# Patient Record
Sex: Male | Born: 1975 | Race: Black or African American | Hispanic: No | Marital: Single | Smoking: Current every day smoker
Health system: Southern US, Community
[De-identification: ages and names within clinical notes are randomized; demographics above are authoritative.]

---

## 1998-06-13 ENCOUNTER — Emergency Department (HOSPITAL_COMMUNITY): Admission: EM | Admit: 1998-06-13 | Discharge: 1998-06-13 | Payer: Self-pay | Admitting: Emergency Medicine

## 1998-06-13 ENCOUNTER — Encounter: Payer: Self-pay | Admitting: Emergency Medicine

## 2000-12-05 ENCOUNTER — Emergency Department (HOSPITAL_COMMUNITY): Admission: EM | Admit: 2000-12-05 | Discharge: 2000-12-05 | Payer: Self-pay

## 2001-02-08 ENCOUNTER — Emergency Department (HOSPITAL_COMMUNITY): Admission: EM | Admit: 2001-02-08 | Discharge: 2001-02-08 | Payer: Self-pay

## 2001-03-24 ENCOUNTER — Emergency Department (HOSPITAL_COMMUNITY): Admission: EM | Admit: 2001-03-24 | Discharge: 2001-03-24 | Payer: Self-pay | Admitting: Emergency Medicine

## 2001-05-15 ENCOUNTER — Emergency Department (HOSPITAL_COMMUNITY): Admission: EM | Admit: 2001-05-15 | Discharge: 2001-05-15 | Payer: Self-pay

## 2005-06-13 ENCOUNTER — Emergency Department (HOSPITAL_COMMUNITY): Admission: EM | Admit: 2005-06-13 | Discharge: 2005-06-13 | Payer: Self-pay | Admitting: Emergency Medicine

## 2006-09-16 ENCOUNTER — Emergency Department (HOSPITAL_COMMUNITY): Admission: EM | Admit: 2006-09-16 | Discharge: 2006-09-16 | Payer: Self-pay | Admitting: Emergency Medicine

## 2006-12-25 ENCOUNTER — Ambulatory Visit: Payer: Self-pay | Admitting: Internal Medicine

## 2009-10-10 ENCOUNTER — Emergency Department (HOSPITAL_COMMUNITY): Admission: EM | Admit: 2009-10-10 | Discharge: 2009-10-10 | Payer: Self-pay | Admitting: Family Medicine

## 2010-01-09 ENCOUNTER — Emergency Department (HOSPITAL_COMMUNITY): Admission: EM | Admit: 2010-01-09 | Discharge: 2010-01-09 | Payer: Self-pay | Admitting: Emergency Medicine

## 2016-03-18 ENCOUNTER — Encounter (HOSPITAL_COMMUNITY): Payer: Self-pay | Admitting: *Deleted

## 2016-03-18 ENCOUNTER — Emergency Department (HOSPITAL_COMMUNITY)
Admission: EM | Admit: 2016-03-18 | Discharge: 2016-03-18 | Disposition: A | Discharge status: Home / Self Care | Payer: No Typology Code available for payment source | Attending: Emergency Medicine | Admitting: Emergency Medicine

## 2016-03-18 ENCOUNTER — Emergency Department (HOSPITAL_COMMUNITY): Payer: No Typology Code available for payment source

## 2016-03-18 DIAGNOSIS — F17210 Nicotine dependence, cigarettes, uncomplicated: Secondary | ICD-10-CM | POA: Insufficient documentation

## 2016-03-18 DIAGNOSIS — S3992XA Unspecified injury of lower back, initial encounter: Secondary | ICD-10-CM | POA: Diagnosis present

## 2016-03-18 DIAGNOSIS — Y92410 Unspecified street and highway as the place of occurrence of the external cause: Secondary | ICD-10-CM | POA: Diagnosis not present

## 2016-03-18 DIAGNOSIS — Y939 Activity, unspecified: Secondary | ICD-10-CM | POA: Insufficient documentation

## 2016-03-18 DIAGNOSIS — Y999 Unspecified external cause status: Secondary | ICD-10-CM | POA: Insufficient documentation

## 2016-03-18 DIAGNOSIS — S39012A Strain of muscle, fascia and tendon of lower back, initial encounter: Secondary | ICD-10-CM | POA: Insufficient documentation

## 2016-03-18 MED ORDER — METHOCARBAMOL 500 MG PO TABS: 500.0000 mg | ORAL_TABLET | Freq: Three times a day (TID) | ORAL | 0 refills | Status: DC | PRN

## 2016-03-18 MED ORDER — METHOCARBAMOL 500 MG PO TABS
1000.0000 mg | ORAL_TABLET | Freq: Once | ORAL | Status: AC
  Administered 2016-03-18: 1000 mg via ORAL
  Filled 2016-03-18: qty 2

## 2016-03-18 MED ORDER — IBUPROFEN 800 MG PO TABS
800.0000 mg | ORAL_TABLET | Freq: Once | ORAL | Status: AC
  Administered 2016-03-18: 800 mg via ORAL
  Filled 2016-03-18: qty 1

## 2016-03-18 MED ORDER — HYDROCODONE-ACETAMINOPHEN 5-325 MG PO TABS
1.0000 | ORAL_TABLET | Freq: Once | ORAL | Status: AC
Start: 2016-03-18 — End: 2016-03-18
  Administered 2016-03-18: 1 via ORAL
  Filled 2016-03-18: qty 1

## 2016-03-18 MED ORDER — NAPROXEN 500 MG PO TABS: 500.0000 mg | ORAL_TABLET | Freq: Two times a day (BID) | ORAL | 0 refills | Status: DC
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
## 2016-03-18 NOTE — ED Notes (Signed)
MD at bedside. 

## 2018-06-24 ENCOUNTER — Encounter (HOSPITAL_COMMUNITY): Payer: Self-pay | Admitting: Emergency Medicine

## 2018-06-24 ENCOUNTER — Emergency Department (HOSPITAL_COMMUNITY)
Admission: EM | Admit: 2018-06-24 | Discharge: 2018-06-24 | Disposition: A | Discharge status: Home / Self Care | Payer: Self-pay | Attending: Emergency Medicine | Admitting: Emergency Medicine

## 2018-06-24 ENCOUNTER — Emergency Department (HOSPITAL_COMMUNITY): Payer: Self-pay

## 2018-06-24 DIAGNOSIS — F17210 Nicotine dependence, cigarettes, uncomplicated: Secondary | ICD-10-CM | POA: Insufficient documentation

## 2018-06-24 DIAGNOSIS — L02512 Cutaneous abscess of left hand: Secondary | ICD-10-CM | POA: Insufficient documentation

## 2018-06-24 DIAGNOSIS — Z23 Encounter for immunization: Secondary | ICD-10-CM | POA: Insufficient documentation

## 2018-06-24 DIAGNOSIS — L02519 Cutaneous abscess of unspecified hand: Secondary | ICD-10-CM

## 2018-06-24 MED ORDER — LIDOCAINE HCL 2 % IJ SOLN
10.0000 mL | Freq: Once | INTRAMUSCULAR | Status: AC
  Administered 2018-06-24: 200 mg
  Filled 2018-06-24: qty 20

## 2018-06-24 MED ORDER — SULFAMETHOXAZOLE-TRIMETHOPRIM 800-160 MG PO TABS
1.0000 | ORAL_TABLET | Freq: Once | ORAL | Status: AC
  Administered 2018-06-24: 1 via ORAL
  Filled 2018-06-24: qty 1

## 2018-06-24 MED ORDER — CEPHALEXIN 500 MG PO CAPS
500.0000 mg | ORAL_CAPSULE | Freq: Once | ORAL | Status: AC
  Administered 2018-06-24: 500 mg via ORAL
  Filled 2018-06-24: qty 1

## 2018-06-24 MED ORDER — TETANUS-DIPHTH-ACELL PERTUSSIS 5-2.5-18.5 LF-MCG/0.5 IM SUSP
0.5000 mL | Freq: Once | INTRAMUSCULAR | Status: AC
  Administered 2018-06-24: 0.5 mL via INTRAMUSCULAR
  Filled 2018-06-24: qty 0.5

## 2018-06-24 MED ORDER — SULFAMETHOXAZOLE-TRIMETHOPRIM 800-160 MG PO TABS: 1.0000 | ORAL_TABLET | Freq: Two times a day (BID) | ORAL | 0 refills | Status: AC

## 2018-06-24 MED ORDER — LIDOCAINE HCL 1 % IJ SOLN
INTRAMUSCULAR | Status: AC
  Filled 2018-06-24: qty 20

## 2018-06-24 MED ORDER — CEPHALEXIN 500 MG PO CAPS: 500.0000 mg | ORAL_CAPSULE | Freq: Four times a day (QID) | ORAL | 0 refills | Status: AC

## 2018-06-24 NOTE — ED Provider Notes (Signed)
[facility]-EMERGENCY DEPT Provider Note   CSN: [id number] Arrival date & time: 06/24/18  1244     History   Chief Complaint Chief Complaint  Patient presents with  . Hand Pain    HPI Caleb Torres is a 42 y.o. male.  HPI  Patient is a 42 year old male with no significant past medical history presenting for left hand pain.  Patient reports that his symptoms began initially 2 weeks ago.  He reports he had a splinter from a piece of wood while working on a project.  He reports that his mother assisted in trying to dislodge it from his hand, however he reports that there may still be some residual splinter.  He reports increasing pain and swelling in couple days ago noted some erythema surrounding the site.  No drainage.  Tetanus shot status unknown.  History reviewed. No pertinent past medical history.  There are no active problems to display for this patient.   History reviewed. No pertinent surgical history.      Home Medications    Prior to Admission medications   Medication Sig Start Date End Date Taking? Authorizing Provider  cephALEXin (KEFLEX) 500 MG capsule Take 1 capsule (500 mg total) by mouth 4 (four) times daily for 7 days. 06/24/18 07/01/18  Aviva KluverMurray, Atticus Wedin B, PA-C  sulfamethoxazole-trimethoprim (BACTRIM DS,SEPTRA DS) 800-160 MG tablet Take 1 tablet by mouth 2 (two) times daily for 7 days. 06/24/18 07/01/18  Elisha PonderMurray, Decari Duggar B, PA-C    Family History No family history on file.  Social History Social History   Tobacco Use  . Smoking status: Current Every Day Smoker    Packs/day: 0.50    Types: Cigarettes  . Smokeless tobacco: Never Used  Substance Use Topics  . Alcohol use: No  . Drug use: No     Allergies   Patient has no known allergies.   Review of Systems Review of Systems  Constitutional: Negative for chills and fever.  Skin: Positive for color change and wound.  Neurological: Negative for weakness and numbness.      Physical Exam Updated Vital Signs BP (!) 125/98 (BP Location: Left Arm)   Pulse 63   Temp 98.1 F (36.7 C) (Oral)   Resp 18   SpO2 100%   Physical Exam  Constitutional: He appears well-developed and well-nourished. No distress.  Sitting comfortably in bed.  HENT:  Head: Normocephalic and atraumatic.  Eyes: Conjunctivae are normal. Right eye exhibits no discharge. Left eye exhibits no discharge.  EOMs normal to gross examination.  Neck: Normal range of motion.  Cardiovascular: Normal rate and regular rhythm.  Intact, 2+ radial pulse of RUE.   Pulmonary/Chest:  Normal respiratory effort. Patient converses comfortably. No audible wheeze or stridor.  Abdominal: He exhibits no distension.  Musculoskeletal: Normal range of motion.  See clinical photo for details.  Patient has a callused area overlying the left hyperthenar region.  No active drainage. Mild amount of surrounding erythema.   Neurological: He is alert.  Cranial nerves intact to gross observation. Patient moves extremities without difficulty.  Skin: Skin is warm and dry. He is not diaphoretic.  Psychiatric: He has a normal mood and affect. His behavior is normal. Judgment and thought content normal.  Nursing note and vitals reviewed.      ED Treatments / Results  Labs (all labs ordered are listed, but only abnormal results are displayed) Labs Reviewed - No data to display  EKG None  Radiology Dg Hand Complete Left  Result Date: 06/24/2018 CLINICAL DATA:  Left hand pain EXAM: LEFT HAND - COMPLETE 3+ VIEW COMPARISON:  None. FINDINGS: There is no evidence of fracture or dislocation. There is no evidence of arthropathy or other focal bone abnormality. Soft tissues are unremarkable. IMPRESSION: Negative. Electronically Signed   By: Elige Ko   On: 06/24/2018 14:52    Procedures .Marland KitchenIncision and Drainage Date/Time: 06/24/2018 11:41 PM Performed by: Elisha Ponder, PA-C Authorized by: Elisha Ponder,  PA-C   Consent:    Consent obtained:  Verbal   Consent given by:  Patient   Risks discussed:  Bleeding, incomplete drainage and pain Location:    Type:  Abscess   Location:  Upper extremity   Upper extremity location:  Hand   Hand location:  L hand Pre-procedure details:    Skin preparation:  Betadine Anesthesia (see MAR for exact dosages):    Anesthesia method:  Local infiltration   Local anesthetic:  Lidocaine 2% w/o epi Procedure type:    Complexity:  Simple Procedure details:    Needle aspiration: no     Incision types:  Stab incision   Incision depth:  Dermal   Scalpel blade:  11   Wound management:  Probed and deloculated   Drainage:  Purulent   Drainage amount:  Scant   Wound treatment:  Wound left open Post-procedure details:    Patient tolerance of procedure:  Procedure terminated at patient's request   (including critical care time)  EMERGENCY DEPARTMENT US SOFT TISSUE INTERPRETATION "Study: Limited Soft Tissue Ultrasound"  INDICATIONS: Soft tissue infection Multiple views of the body part were obtained in real-time with a multi-frequency linear probe PERFORMED BY:  Myself IMAGES ARCHIVED?: Yes SIDE:Left BODY PART:hand FINDINGS: Other Fluid collection; possible foreign body within fluid collection. INTERPRETATION:  Fluid collection present.    CPT: Neck [id number]  Upper extremity [id number]  Axilla [id number]  Chest wall 19147-82  Beast 95621-30  Upper back 86578-46  Lower back 96295-28  Abdominal wall 41324-40  Pelvic wall 10272-53  Lower extremity 66440-34  Other soft tissue 74259-56   Medications Ordered in ED Medications  Tdap (BOOSTRIX) injection 0.5 mL (0.5 mLs Intramuscular Given 06/24/18 1540)  lidocaine (XYLOCAINE) 2 % (with pres) injection 200 mg (200 mg Infiltration Given 06/24/18 1546)  sulfamethoxazole-trimethoprim (BACTRIM DS,SEPTRA DS) 800-160 MG per tablet 1 tablet (1 tablet Oral Given 06/24/18 1731)  cephALEXin (KEFLEX)  capsule 500 mg (500 mg Oral Given 06/24/18 1730)     Initial Impression / Assessment and Plan / ED Course  I have reviewed the triage vital signs and the nursing notes.  Pertinent labs & imaging results that were available during my care of the patient were reviewed by me and considered in my medical decision making (see chart for details).  Clinical Course as of Jun 24 2314  Wynelle Link Jun 24, 2018  1756 Spoke with Dr. Dion Saucier who states pt can come see him in the office on Wednesday.   [AM]    Clinical Course User Index [AM] Elisha Ponder, PA-C    Patient nontoxic-appearing, afebrile, in no acute distress.  Neurovascularly intact in the left upper extremity.  Patient with possible retained foreign body from his wooden splinter.  I&D performed successfully.  Patient would not recovered, and patient terminated procedure due to tolerance.  I discussed with the patient that we did not confirm that sliver was removed, but will be given follow-up to hand surgery for recheck later this week. Tdap updated.   Discussed case  with Dr. Dion Saucier of orthopedics, states patient can follow-up on Wednesday, 06-27-2018 for recheck.  Patient instructed in the interim perform warm compresses and how to perform dressing changes.  Patient given antibiotic coverage given surrounding erythema.  Return precautions given for any increasing erythema, drainage, or swelling.  Patient is in understanding and agrees with the plan of care.  Final Clinical Impressions(s) / ED Diagnoses   Final diagnoses:  Abscess of hand    ED Discharge Orders         Ordered    sulfamethoxazole-trimethoprim (BACTRIM DS,SEPTRA DS) 800-160 MG tablet  2 times daily     06/24/18 1740    cephALEXin (KEFLEX) 500 MG capsule  4 times daily     06/24/18 1740           Delia Chimes 06/24/18 2341    Arby Barrette, MD 06/25/18 0017

## 2018-06-24 NOTE — Discharge Instructions (Signed)
Please see the information and instructions below regarding your visit.  Your diagnoses today include:  1. Abscess of hand     Abscesses form when an infection in your skin starts to collect bacteria and white blood cells, walling it off from the rest of your body to protect you from a bigger infection. Risk factors for this type of infection include:  ?Break in the skin ?Diabetes ?Swollen areas  Sometimes the infection starts to spread to surrounding tissue, causing redness and swelling. We call this cellulitis.   Tests performed today include: See side panel of your discharge paperwork for testing performed today. Vital signs are listed at the bottom of these instructions.   Medications prescribed:    Take any prescribed medications only as prescribed, and any over the counter medications only as directed on the packaging.   Please take all of your antibiotics until finished.   You may develop abdominal discomfort or nausea from the antibiotic. If this occurs, you may take it with food. Some patients also get diarrhea with antibiotics. You may help offset this with probiotics which you can buy or get in yogurt. Do not eat or take the probiotics until 2 hours after your antibiotic. Some women develop vaginal yeast infections after antibiotics. If you develop unusual vaginal discharge after being on this medication, please see your primary care provider.   Some people develop allergies to antibiotics. Symptoms of antibiotic allergy can be mild and include a flat rash and itching. They can also be more serious and include:  ?Hives - Hives are raised, red patches of skin that are usually very itchy.  ?Lip or tongue swelling  ?Trouble swallowing or breathing  ?Blistering of the skin or mouth.  If you have any of these serious symptoms, please seek emergency medical care immediately.  Please alternate ibuprofen and Tylenol for the pain.    Home care instructions:  Please follow  any educational materials contained in this packet.   Some things that may promote healing of your wound and infection include:  Raise your arm or leg to reduce swelling - Raise the arm or leg up above the level of your heart 3 or 4 times a day, for 30 minutes each time. Keep the infected area clean and dry. You can take a shower or bath, but be sure to pat the area dry with a towel afterward. Do not put any antibiotic ointments or creams on the area. Reapply a dry gauze dressing any time the bandage has become soaked with drainage, or after cleansing the wound.  Apply warm compresses to the wound 3-4 times daily to encourage drainage.   Return instructions:  Please return to the Emergency Department if you experience worsening symptoms. You should return for reevaluation of your infection if you notice spreading redness, increased swelling, an abscess develops, or you develop signs and symptoms of a systemic illness such as fever and chills.  Please return if you have any other emergent concerns.  Additional Information:   Your vital signs today were: BP (!) 125/98 (BP Location: Left Arm)    Pulse 96    Temp 98.1 F (36.7 C) (Oral)    Resp 18    SpO2 99%  If your blood pressure (BP) was elevated on multiple readings during this visit above 130 for the top number or above 80 for the bottom number, please have this repeated by your primary care provider within one month. --------------  Thank you for allowing us to  participate in your care today.

## 2018-06-24 NOTE — ED Triage Notes (Signed)
Per pt, states he removed a splinter from left palm 2 weeks ago-states he is not sure if he got all of it out-states pain and swelling

## 2018-12-29 ENCOUNTER — Emergency Department (HOSPITAL_COMMUNITY)
Admission: EM | Admit: 2018-12-29 | Discharge: 2018-12-29 | Disposition: A | Discharge status: Home / Self Care | Payer: Self-pay | Attending: Emergency Medicine | Admitting: Emergency Medicine

## 2018-12-29 ENCOUNTER — Encounter (HOSPITAL_COMMUNITY): Payer: Self-pay | Admitting: Emergency Medicine

## 2018-12-29 ENCOUNTER — Other Ambulatory Visit: Payer: Self-pay

## 2018-12-29 DIAGNOSIS — F17210 Nicotine dependence, cigarettes, uncomplicated: Secondary | ICD-10-CM | POA: Insufficient documentation

## 2018-12-29 DIAGNOSIS — M25561 Pain in right knee: Secondary | ICD-10-CM | POA: Insufficient documentation

## 2018-12-29 MED ORDER — NAPROXEN 375 MG PO TABS: 375.0000 mg | ORAL_TABLET | Freq: Two times a day (BID) | ORAL | 0 refills | Status: AC
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
## 2018-12-29 NOTE — ED Notes (Signed)
Patient ambulated from triage to room 9.
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
## 2018-12-29 NOTE — Discharge Instructions (Addendum)
You may alternate taking Tylenol and Naproxen as needed for pain control. You may take Naproxen twice daily as directed on your discharge paperwork and you may take  [phone] mg of Tylenol every 6 hours. Do not exceed 4000 mg of Tylenol daily as this can lead to liver damage. Also, make sure to take Naproxen with meals as it can cause an upset stomach. Do not take other NSAIDs while taking Naproxen such as (Aleve, Ibuprofen, Aspirin, Celebrex, etc) and do not take more than the prescribed dose as this can lead to ulcers and bleeding in your GI tract. You may use warm and cold compresses to help with your symptoms.   Please follow up with your primary doctor or the orthopedic doctor within the next 7-10 days for re-evaluation and further treatment of your symptoms.   Please return to the ER sooner if you have any new or worsening symptoms.

## 2018-12-29 NOTE — ED Notes (Signed)
Patient given discharge teaching and verbalized understanding. Patient ambulated out of ED with a steady gait. 

## 2019-01-19 ENCOUNTER — Emergency Department (HOSPITAL_COMMUNITY): Payer: Self-pay

## 2019-01-19 ENCOUNTER — Emergency Department (HOSPITAL_COMMUNITY)
Admission: EM | Admit: 2019-01-19 | Discharge: 2019-01-19 | Disposition: A | Discharge status: Home / Self Care | Payer: Self-pay | Attending: Emergency Medicine | Admitting: Emergency Medicine

## 2019-01-19 ENCOUNTER — Encounter (HOSPITAL_COMMUNITY): Payer: Self-pay

## 2019-01-19 ENCOUNTER — Other Ambulatory Visit: Payer: Self-pay

## 2019-01-19 DIAGNOSIS — L03211 Cellulitis of face: Secondary | ICD-10-CM | POA: Insufficient documentation

## 2019-01-19 DIAGNOSIS — F17210 Nicotine dependence, cigarettes, uncomplicated: Secondary | ICD-10-CM | POA: Insufficient documentation

## 2019-01-19 LAB — BASIC METABOLIC PANEL
Anion gap: 11 (ref 5–15)
BUN: 10 mg/dL (ref 6–20)
CO2: 23 mmol/L (ref 22–32)
Calcium: 9.2 mg/dL (ref 8.9–10.3)
Chloride: 104 mmol/L (ref 98–111)
Creatinine, Ser: 1.06 mg/dL (ref 0.61–1.24)
GFR calc Af Amer: >60 mL/min (ref >60)
GFR calc non Af Amer: >60 mL/min (ref >60)
Glucose, Bld: 93 mg/dL (ref 70–99)
Potassium: 3.9 mmol/L (ref 3.5–5.1)
Sodium: 138 mmol/L (ref 135–145)

## 2019-01-19 LAB — CBC
HCT: 42.5 % (ref 39.0–52.0)
Hemoglobin: 14 g/dL (ref 13.0–17.0)
MCH: 32.5 pg (ref 26.0–34.0)
MCHC: 32.9 g/dL (ref 30.0–36.0)
MCV: 98.6 fL (ref 80.0–100.0)
Platelets: 240 10*3/uL (ref 150–400)
RBC: 4.31 MIL/uL (ref 4.22–5.81)
RDW: 13.1 % (ref 11.5–15.5)
WBC: 10.7 10*3/uL — ABNORMAL HIGH (ref 4.0–10.5)
nRBC: 0 % (ref 0.0–0.2)

## 2019-01-19 MED ORDER — IOHEXOL 300 MG/ML  SOLN
75.0000 mL | Freq: Once | INTRAMUSCULAR | Status: AC | PRN
  Administered 2019-01-19: 75 mL via INTRAVENOUS

## 2019-01-19 MED ORDER — SULFAMETHOXAZOLE-TRIMETHOPRIM 800-160 MG PO TABS: 1.0000 | ORAL_TABLET | Freq: Two times a day (BID) | ORAL | 0 refills | Status: AC

## 2019-01-19 MED ORDER — AMOXICILLIN-POT CLAVULANATE 875-125 MG PO TABS: 1.0000 | ORAL_TABLET | Freq: Two times a day (BID) | ORAL | 0 refills | Status: AC

## 2019-01-19 MED ORDER — SODIUM CHLORIDE (PF) 0.9 % IJ SOLN
INTRAMUSCULAR | Status: AC
  Filled 2019-01-19: qty 50

## 2019-01-19 MED ORDER — FLUORESCEIN SODIUM 1 MG OP STRP
1.0000 | ORAL_STRIP | Freq: Once | OPHTHALMIC | Status: AC
  Administered 2019-01-19: 1 via OPHTHALMIC
  Filled 2019-01-19: qty 1

## 2019-01-19 MED ORDER — TETRACAINE HCL 0.5 % OP SOLN
1.0000 [drp] | Freq: Once | OPHTHALMIC | Status: AC
  Administered 2019-01-19: 12:00:00 1 [drp] via OPHTHALMIC
  Filled 2019-01-19: qty 4

## 2019-01-19 NOTE — ED Triage Notes (Addendum)
Patient dropped off by friend.    Patient c/o nasal infection.  Patient states his right side of face is swollen and hurts. 8/10 sore   Denies sore throat or lost of taste.  Denies being in contact with anyone who has been sick or tested positive for covid.  Denies difficulty breathing  Denies N/V Denies Diarrhea    A/ox4 Ambulatory in triage.

## 2019-01-19 NOTE — ED Provider Notes (Signed)
[facility]-EMERGENCY DEPT Provider Note   CSN: [id number] Arrival date & time: 01/19/19  0935    History   Chief Complaint Chief Complaint  Patient presents with   Nasal Congestion    HPI Caleb Torres is a 43 y.o. male with no PMHx who presents to the ED with c/o right-sided sinus congestion.   Patient states his symptoms began 3 days ago and have progressively worsened. He describes a feeling of swelling and fullness in his right-sided nasal area that has also been causing right sided eye swelling and drainage. Drainage has started to "smell bad." He endorses throbbing pain in his right eye and surrounding orbital region that also occurs with lateral eye movement. Pain in the right sided sinus area is exacerbated by movement and partially alleviated with Vicks and hot towel soaks. Endorses headache, mainly on the right temple, and minimal rhinorrhea but denies fever, chills, sore throat, ear pain, ear fullness cough, SOB. Denies sexual activity involving the affected areas. Denies drug use.    The history is provided by the patient.    History reviewed. No pertinent past medical history.  There are no active problems to display for this patient.   History reviewed. No pertinent surgical history.      Home Medications    Prior to Admission medications   Medication Sig Start Date End Date Taking? Authorizing Provider  amoxicillin-clavulanate (AUGMENTIN) 875-125 MG tablet Take 1 tablet by mouth 2 (two) times daily for 10 days. 01/19/19 01/29/19  Verdene LennertBasaraba, Lore Polka, MD  sulfamethoxazole-trimethoprim (BACTRIM DS) 800-160 MG tablet Take 1 tablet by mouth 2 (two) times daily. 01/19/19   Verdene LennertBasaraba, Schneur Crowson, MD    Family History History reviewed. No pertinent family history.  Social History Social History   Tobacco Use   Smoking status: Current Every Day Smoker    Packs/day: 0.50    Types: Cigarettes   Smokeless tobacco: Never Used  Substance Use Topics    Alcohol use: No   Drug use: No     Allergies   Patient has no known allergies.   Review of Systems Review of Systems  Constitutional: Negative for chills and fever.  HENT: Positive for congestion, facial swelling, rhinorrhea (minimal), sinus pressure and sinus pain. Negative for dental problem, ear discharge, ear pain, hearing loss, sore throat and tinnitus.   Eyes: Positive for pain and discharge. Negative for photophobia and visual disturbance.       Right eyelid swelling  Respiratory: Negative for cough and shortness of breath.   All other systems reviewed and are negative.    Physical Exam Updated Vital Signs BP 118/81 (BP Location: Left Arm)    Pulse 97    Temp 99.1 F (37.3 C) (Oral)    Resp 17    SpO2 97%   Physical Exam Vitals signs reviewed.  Constitutional:      General: He is not in acute distress.    Appearance: Normal appearance.  HENT:     Head: Normocephalic and atraumatic. Right periorbital erythema present. No abrasion, masses or laceration.     Nose:     Right Sinus: Maxillary sinus tenderness (minimal) present.     Mouth/Throat:     Mouth: Mucous membranes are moist.     Dentition: Dental caries present.     Pharynx: Oropharynx is clear.  Eyes:     General:        Right eye: Discharge present. No foreign body or hordeolum.     Extraocular Movements: Extraocular  movements intact.     Conjunctiva/sclera: Conjunctivae normal.     Pupils: Pupils are equal, round, and reactive to light.     Comments: Some purulent drainage seen at inner corner of right eye. Wood lamp examination did not show any abnormalities.   Cardiovascular:     Rate and Rhythm: Normal rate and regular rhythm.  Pulmonary:     Effort: Pulmonary effort is normal.     Breath sounds: Normal breath sounds.  Skin:    General: Skin is warm and dry.  Neurological:     General: No focal deficit present.     Mental Status: He is alert and oriented to person, place, and time.   Psychiatric:        Mood and Affect: Mood normal.        Behavior: Behavior normal.      ED Treatments / Results  Labs (all labs ordered are listed, but only abnormal results are displayed) Labs Reviewed  CBC - Abnormal; Notable for the following components:      Result Value   WBC 10.7 (*)    All other components within normal limits  BASIC METABOLIC PANEL    EKG None  Radiology Ct Maxillofacial W Contrast  Result Date: 01/19/2019 CLINICAL DATA:  Nasal infection with right-sided facial swelling. EXAM: CT MAXILLOFACIAL WITH CONTRAST TECHNIQUE: Multidetector CT imaging of the maxillofacial structures was performed with intravenous contrast. Multiplanar CT image reconstructions were also generated. CONTRAST:  6mL OMNIPAQUE IOHEXOL 300 MG/ML  SOLN COMPARISON:  None. 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 This includes lateral cortical erosion. This is probably the origin of the right facial cellulitis that is present. No evidence of drainable soft tissue abscess. There is probably also communication with the right maxillary sinus which shows subtotal opacification. No other acutely significant bone finding. 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 Orbits: Normal.  No orbital inflammatory disease. Sinuses: Paranasal sinuses on the left are clear. On the right, there is subtotal opacification of the right maxillary sinus, probably relating to the maxillary dental pathology. There is also subtotal opacification of the right ethmoid sinuses and mild mucosal thickening at the right frontal ethmoid junction. Soft tissues: Nonspecific inflammatory changes of the right face consistent with cellulitis as noted above. Limited intracranial: Negative IMPRESSION: Right facial swelling consistent with facial cellulitis. No evidence of drainable abscess. The etiology of this probably  relates to dental and periodontal disease at right maxillary teeth 4 and 5. There is lateral cortical breakthrough. There is probably also breakthrough into the maxillary sinus, responsible for inflammatory changes of the right maxillary sinus. Patient additionally has right ethmoid and frontal sinus inflammation as well. Electronically Signed   By: Nelson Chimes M.D.   On: 01/19/2019 13:16    Procedures Procedures (including critical care time)  Medications Ordered in ED Medications  sodium chloride (PF) 0.9 % injection (has no administration in time range)  tetracaine (PONTOCAINE) 0.5 % ophthalmic solution 1 drop (1 drop Right Eye Given by Other 01/19/19 1134)  fluorescein ophthalmic strip 1 strip (1 strip Right Eye Given by Other 01/19/19 1134)  iohexol (OMNIPAQUE) 300 MG/ML solution 75 mL (75 mLs Intravenous Contrast Given 01/19/19 1306)     Initial Impression / Assessment and Plan / ED Course  I have reviewed the triage vital signs and the nursing notes.  Pertinent labs & imaging results that were available  during my care of the patient were reviewed by me and considered in my medical decision making (see chart for details).  Pre-Septal Cellulitis:  Caleb Torres presented to the ED with c/o sinus fullness with right eye and facial pain. His right eye had pain associated with movement of his head and with lateral movement of EOM. On examination, he had minimal right maxillary tenderness to palpation, however he did have significant pre-orbital swelling and tenderness. In order to rule out orbital cellulitis, a CT maxillofacial w/wo contrast was ordered. CBC was WNL except for a slightly elevated WBC consistent with a cellulitis vs sinusitis. Wood slit lamp did not show any lesions. CT results came back consistent with a facial cellulitis with sinus inflammation likely secondary to dental and periodental disease. There was no involvement of the orbital area. Due to the extent of patient's inflammation  and closeness to his orbital region, we decided to treat as pre-septal cellulitis. Bactrim 160-800mg  BID and Augmentin 875mg  BID for 10 days each was prescribed.   Patient verbalized understanding regarding his examinations and the treatment plan. He is in agreement and plans to complete antibiotic treatment. He will follow up with his regular dentist after he finishes his antibiotics for treatment of his dental disease to avoid future complications. Caleb Torres was found to be safe for discharge at that time.   Final Clinical Impressions(s) / ED Diagnoses   Final diagnoses:  Facial cellulitis    ED Discharge Orders         Ordered    sulfamethoxazole-trimethoprim (BACTRIM DS) 800-160 MG tablet  2 times daily     01/19/19 1345    amoxicillin-clavulanate (AUGMENTIN) 875-125 MG tablet  2 times daily     01/19/19 1345           Verdene LennertBasaraba, Josealfredo Adkins, MD 01/19/19 1432    Sabas SousBero, Michael M, MD 01/23/19 1030

## 2019-01-19 NOTE — Discharge Instructions (Addendum)
Please return to the ED if your symptoms worsen.

## 2019-07-02 ENCOUNTER — Emergency Department (HOSPITAL_COMMUNITY)
Admission: EM | Admit: 2019-07-02 | Discharge: 2019-07-02 | Disposition: A | Discharge status: Home / Self Care | Payer: Self-pay | Attending: Emergency Medicine | Admitting: Emergency Medicine

## 2019-07-02 ENCOUNTER — Other Ambulatory Visit: Payer: Self-pay

## 2019-07-02 ENCOUNTER — Emergency Department (HOSPITAL_COMMUNITY): Payer: Self-pay

## 2019-07-02 ENCOUNTER — Encounter (HOSPITAL_COMMUNITY): Payer: Self-pay

## 2019-07-02 DIAGNOSIS — M25462 Effusion, left knee: Secondary | ICD-10-CM | POA: Insufficient documentation

## 2019-07-02 DIAGNOSIS — F17210 Nicotine dependence, cigarettes, uncomplicated: Secondary | ICD-10-CM | POA: Insufficient documentation

## 2019-07-02 DIAGNOSIS — M25562 Pain in left knee: Secondary | ICD-10-CM | POA: Insufficient documentation

## 2019-07-02 MED ORDER — NAPROXEN 500 MG PO TABS: 500.0000 mg | ORAL_TABLET | Freq: Two times a day (BID) | ORAL | 0 refills | Status: DC

## 2019-07-02 NOTE — Discharge Instructions (Signed)
Try to wear your knee sleeve at work and take the anti-inflammatory.  Over the next few days rest to try to electrically get better.
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
## 2020-02-24 ENCOUNTER — Encounter (HOSPITAL_COMMUNITY): Payer: Self-pay

## 2020-02-24 ENCOUNTER — Other Ambulatory Visit: Payer: Self-pay

## 2020-02-24 ENCOUNTER — Emergency Department (HOSPITAL_COMMUNITY)
Admission: EM | Admit: 2020-02-24 | Discharge: 2020-02-24 | Disposition: A | Discharge status: Home / Self Care | Payer: Self-pay | Attending: Emergency Medicine | Admitting: Emergency Medicine

## 2020-02-24 ENCOUNTER — Emergency Department (HOSPITAL_COMMUNITY): Payer: Self-pay

## 2020-02-24 DIAGNOSIS — M25561 Pain in right knee: Secondary | ICD-10-CM | POA: Insufficient documentation

## 2020-02-24 DIAGNOSIS — F17210 Nicotine dependence, cigarettes, uncomplicated: Secondary | ICD-10-CM | POA: Insufficient documentation

## 2020-02-24 DIAGNOSIS — M26629 Arthralgia of temporomandibular joint, unspecified side: Secondary | ICD-10-CM | POA: Insufficient documentation

## 2020-02-24 DIAGNOSIS — M25562 Pain in left knee: Secondary | ICD-10-CM | POA: Insufficient documentation
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
## 2020-02-24 NOTE — Discharge Instructions (Signed)
Please follow up with [facility] for further evaluation of your knee pain. I would recommend 600 mg Ibuprofen every 6-8 hours as needed for pain in the meantime.   Return to the ED for any worsening symptoms

## 2020-02-24 NOTE — ED Triage Notes (Signed)
Patient c/o bilateral knee pain and stiffness.

## 2021-01-08 ENCOUNTER — Encounter (HOSPITAL_COMMUNITY): Payer: Self-pay

## 2021-01-08 ENCOUNTER — Other Ambulatory Visit: Payer: Self-pay

## 2021-01-08 ENCOUNTER — Emergency Department (HOSPITAL_COMMUNITY)
Admission: EM | Admit: 2021-01-08 | Discharge: 2021-01-08 | Disposition: A | Discharge status: Home / Self Care | Payer: BC Managed Care – PPO | Attending: Emergency Medicine | Admitting: Emergency Medicine

## 2021-01-08 ENCOUNTER — Emergency Department (HOSPITAL_COMMUNITY)
Admission: EM | Admit: 2021-01-08 | Discharge: 2021-01-08 | Disposition: A | Discharge status: Home / Self Care | Payer: BC Managed Care – PPO | Source: Home / Self Care | Attending: Emergency Medicine | Admitting: Emergency Medicine

## 2021-01-08 DIAGNOSIS — H6123 Impacted cerumen, bilateral: Secondary | ICD-10-CM | POA: Insufficient documentation

## 2021-01-08 DIAGNOSIS — Z20822 Contact with and (suspected) exposure to covid-19: Secondary | ICD-10-CM | POA: Diagnosis not present

## 2021-01-08 DIAGNOSIS — R059 Cough, unspecified: Secondary | ICD-10-CM | POA: Diagnosis present

## 2021-01-08 DIAGNOSIS — Z5321 Procedure and treatment not carried out due to patient leaving prior to being seen by health care provider: Secondary | ICD-10-CM

## 2021-01-08 DIAGNOSIS — J029 Acute pharyngitis, unspecified: Secondary | ICD-10-CM | POA: Insufficient documentation

## 2021-01-08 DIAGNOSIS — J069 Acute upper respiratory infection, unspecified: Secondary | ICD-10-CM

## 2021-01-08 DIAGNOSIS — F17210 Nicotine dependence, cigarettes, uncomplicated: Secondary | ICD-10-CM | POA: Insufficient documentation

## 2021-01-08 DIAGNOSIS — R6883 Chills (without fever): Secondary | ICD-10-CM | POA: Insufficient documentation

## 2021-01-08 DIAGNOSIS — Z28310 Unvaccinated for covid-19: Secondary | ICD-10-CM | POA: Diagnosis not present

## 2021-01-08 DIAGNOSIS — H9203 Otalgia, bilateral: Secondary | ICD-10-CM | POA: Insufficient documentation

## 2021-01-08 DIAGNOSIS — R0981 Nasal congestion: Secondary | ICD-10-CM | POA: Insufficient documentation

## 2021-01-08 DIAGNOSIS — M791 Myalgia, unspecified site: Secondary | ICD-10-CM | POA: Insufficient documentation

## 2021-01-08 LAB — RESP PANEL BY RT-PCR (FLU A&B, COVID) ARPGX2
Influenza A by PCR: NEGATIVE
Influenza B by PCR: NEGATIVE
SARS Coronavirus 2 by RT PCR: NEGATIVE

## 2021-01-08 MED ORDER — FLUTICASONE PROPIONATE 50 MCG/ACT NA SUSP: 2.0000 | Freq: Every day | NASAL | 1 refills | Status: AC

## 2021-01-08 NOTE — ED Notes (Signed)
Pt reports he had to leave to pick up somebody from surgery.

## 2021-01-08 NOTE — ED Provider Notes (Signed)
  This patient left the room after I signed up for him, but before I was able to evaluate him.   Anselm Pancoast, PA-C 01/08/21 1239    Alvira Monday, MD 01/08/21 2146

## 2021-01-08 NOTE — ED Triage Notes (Signed)
Patient c/o body aches, chills, sore throat, nasal congestion, and bilateral ear pain x 2 days.

## 2021-01-08 NOTE — ED Triage Notes (Signed)
Patient c/o body aches, chills, sore throat and bilateral ear pain x 2 days. Patient was here earlier today, but left due to pick up a family member after having surgery.

## 2021-01-08 NOTE — ED Provider Notes (Signed)
[facility]-EMERGENCY DEPT Provider Note   CSN: [id number] Arrival date & time: 01/08/21  1513     History Chief Complaint  Patient presents with  . Headache  . Chills    Caleb Torres is a 45 y.o. male.  HPI Patient is a 45 year old male with a medical history as noted below.  He presents to the emergency department due to body aches, chills, throat irritation, sinus pressure, rhinorrhea, dry cough.  Symptoms started about 2 days ago.  States due to the severity of his symptoms he has been unable to go to work for the past 2 days and wanted to come to the emergency department for evaluation as well as a work note.  Denies any chest pain, shortness of breath, abdominal pain, nausea, vomiting, diarrhea, urinary complaints.  He is not vaccinated for COVID-19 and states that he has not had a known COVID-19 infection previously.    History reviewed. No pertinent past medical history.  There are no problems to display for this patient.   History reviewed. No pertinent surgical history.     Family History  Family history unknown: Yes    Social History   Tobacco Use  . Smoking status: Every Day    Packs/day: 0.15    Pack years: 0.00    Types: Cigarettes  . Smokeless tobacco: Never  Vaping Use  . Vaping Use: Never used  Substance Use Topics  . Alcohol use: No  . Drug use: No    Home Medications Prior to Admission medications   Medication Sig Start Date End Date Taking? Authorizing Provider  fluticasone (FLONASE) 50 MCG/ACT nasal spray Place 2 sprays into both nostrils daily. 01/08/21  Yes Placido Sou, PA-C  naproxen (NAPROSYN) 500 MG tablet Take 1 tablet (500 mg total) by mouth 2 (two) times daily. Patient not taking: Reported on 02/24/2020 07/02/19   Gwyneth Sprout, MD  sulfamethoxazole-trimethoprim (BACTRIM DS) 800-160 MG tablet Take 1 tablet by mouth 2 (two) times daily. Patient not taking: Reported on 02/24/2020 01/19/19   Verdene Lennert, MD     Allergies    Aspirin  Review of Systems   Review of Systems  Constitutional:  Positive for fatigue. Negative for chills and fever.  HENT:  Positive for congestion, ear pain, postnasal drip, rhinorrhea, sinus pain and sore throat. Negative for ear discharge.   Respiratory:  Positive for cough. Negative for shortness of breath.   Cardiovascular:  Negative for chest pain.  Gastrointestinal:  Negative for abdominal pain, nausea and vomiting.   Physical Exam Updated Vital Signs BP 122/77 (BP Location: Left Arm)   Pulse 88   Temp 99.2 F (37.3 C) (Oral)   Resp 16   Ht 6\' 1"  (1.854 m)   Wt 79.4 kg   SpO2 98%   BMI 23.09 kg/m   Physical Exam Vitals and nursing note reviewed.  Constitutional:      General: He is not in acute distress.    Appearance: Normal appearance. He is well-developed. He is not ill-appearing, toxic-appearing or diaphoretic.  HENT:     Head: Normocephalic and atraumatic.     Comments: Bilateral external ears appear normal.  Bilateral cerumen impaction.  I irrigated the ears extensively and removed a large amount of cerumen but there is still cerumen impaction in the distal EAC's and was unable to visualize the TMs.   Uvula is midline.  No significant erythema noted to the posterior oropharynx.  No exudate.  No tonsillar hypertrophy.  Readily handling secretions.  No hot potato voice.    Right Ear: External ear normal.     Left Ear: External ear normal.     Nose: Congestion and rhinorrhea present.     Comments: Congestion and rhinorrhea.  Boggy erythematous nasal turbinates noted bilaterally.    Mouth/Throat:     Mouth: Mucous membranes are moist.     Pharynx: Oropharynx is clear. No oropharyngeal exudate or posterior oropharyngeal erythema.  Eyes:     General: No scleral icterus.       Right eye: No discharge.        Left eye: No discharge.     Extraocular Movements: Extraocular movements intact.     Right eye: Normal extraocular motion and no  nystagmus.     Left eye: Normal extraocular motion and no nystagmus.     Conjunctiva/sclera: Conjunctivae normal.     Pupils: Pupils are equal, round, and reactive to light. Pupils are equal.     Right eye: Pupil is round and reactive.     Left eye: Pupil is round and reactive.  Cardiovascular:     Rate and Rhythm: Normal rate and regular rhythm.     Pulses: Normal pulses.     Heart sounds: Normal heart sounds. No murmur heard.   No friction rub. No gallop.     Comments: Regular rate and rhythm without murmurs, rubs, or gallops. Pulmonary:     Effort: Pulmonary effort is normal. No respiratory distress.     Breath sounds: Normal breath sounds. No stridor. No wheezing, rhonchi or rales.     Comments: LCTAB without wheezing, rales or rhonchi. Abdominal:     General: Abdomen is flat.     Palpations: Abdomen is soft.     Tenderness: There is no abdominal tenderness.  Musculoskeletal:        General: Normal range of motion.     Cervical back: Normal range of motion and neck supple. No tenderness.  Skin:    General: Skin is warm and dry.  Neurological:     General: No focal deficit present.     Mental Status: He is alert and oriented to person, place, and time.     GCS: GCS eye subscore is 4. GCS verbal subscore is 5. GCS motor subscore is 6.  Psychiatric:        Mood and Affect: Mood normal.        Behavior: Behavior normal.   ED Results / Procedures / Treatments   Labs (all labs ordered are listed, but only abnormal results are displayed) Labs Reviewed  RESP PANEL BY RT-PCR (FLU A&B, COVID) [id number]    EKG None  Radiology No results found.  Procedures Procedures   Medications Ordered in ED Medications - No data to display  ED Course  I have reviewed the triage vital signs and the nursing notes.  Pertinent labs & imaging results that were available during my care of the patient were reviewed by me and considered in my medical decision making (see chart for  details).    MDM Rules/Calculators/A&P                          Patient is a 45 year old male with symptoms consistent with a viral URI.  Physical exam mostly reassuring.  Uvula midline.  No erythema or exudates in the posterior oropharynx.  Lungs are clear to auscultation bilaterally.  Regular rate and rhythm without murmurs, rubs, or gallops.  He does have some  erythema and boggy nasal turbinates in the bilateral nares.  Unable to assess his TMs due to extensive cerumen impactions in both ears but after further discussion patient notes that his pain is focused around the sinuses and radiates towards the side of his head and denies any focal ear pain.  Patient has not been vaccinated for COVID-19 and has no known previous COVID-19 infections.  We will obtain a respiratory panel.  Patient is going to check these results on MyChart later today.  Will provide a prescription for fluticasone.  Feel the patient is stable for discharge at this time and he is agreeable.  Discussed return precautions.  Discussed quarantining if he finds that his COVID-19 test is positive.  His questions were answered and he was amicable at the time of discharge.  Final Clinical Impression(s) / ED Diagnoses Final diagnoses:  Viral URI with cough   Rx / DC Orders ED Discharge Orders          Ordered    fluticasone (FLONASE) 50 MCG/ACT nasal spray  Daily        01/08/21 1616             Placido Sou, PA-C 01/08/21 1620    Arby Barrette, MD 01/13/21 1025

## 2021-01-08 NOTE — Discharge Instructions (Addendum)
Like we discussed, please check the results of your COVID-19 test on MyChart.  They should be available later today.  If you find that your test is positive you will need to quarantine based on the current CDC guidelines.  I would recommend trying Flonase for help with your congestion and runny nose.  This can be purchased over-the-counter but I will also provide a prescription in the event that this is cheaper.  Please use this twice a day in each nostril.  Please continue to monitor your symptoms closely.  If you develop any new or worsening symptoms please come back to the emergency department.  It was a pleasure to meet you.

## 2021-02-04 ENCOUNTER — Encounter (HOSPITAL_COMMUNITY): Payer: Self-pay

## 2021-02-04 ENCOUNTER — Other Ambulatory Visit: Payer: Self-pay

## 2021-02-04 ENCOUNTER — Emergency Department (HOSPITAL_COMMUNITY)
Admission: EM | Admit: 2021-02-04 | Discharge: 2021-02-04 | Disposition: A | Discharge status: Home / Self Care | Payer: BC Managed Care – PPO | Attending: Emergency Medicine | Admitting: Emergency Medicine

## 2021-02-04 ENCOUNTER — Emergency Department (HOSPITAL_COMMUNITY): Payer: BC Managed Care – PPO

## 2021-02-04 DIAGNOSIS — R630 Anorexia: Secondary | ICD-10-CM | POA: Diagnosis not present

## 2021-02-04 DIAGNOSIS — K0889 Other specified disorders of teeth and supporting structures: Secondary | ICD-10-CM | POA: Diagnosis not present

## 2021-02-04 DIAGNOSIS — R059 Cough, unspecified: Secondary | ICD-10-CM | POA: Diagnosis present

## 2021-02-04 DIAGNOSIS — U071 COVID-19: Secondary | ICD-10-CM

## 2021-02-04 DIAGNOSIS — F17210 Nicotine dependence, cigarettes, uncomplicated: Secondary | ICD-10-CM | POA: Insufficient documentation

## 2021-02-04 DIAGNOSIS — R5383 Other fatigue: Secondary | ICD-10-CM

## 2021-02-04 LAB — BASIC METABOLIC PANEL
Anion gap: 8 (ref 5–15)
BUN: 10 mg/dL (ref 6–20)
CO2: 25 mmol/L (ref 22–32)
Calcium: 9.1 mg/dL (ref 8.9–10.3)
Chloride: 106 mmol/L (ref 98–111)
Creatinine, Ser: 0.91 mg/dL (ref 0.61–1.24)
GFR, Estimated: >60 mL/min (ref >60)
Glucose, Bld: 79 mg/dL (ref 70–99)
Potassium: 3.8 mmol/L (ref 3.5–5.1)
Sodium: 139 mmol/L (ref 135–145)

## 2021-02-04 LAB — CBC WITH DIFFERENTIAL/PLATELET
Abs Immature Granulocytes: 0.07 10*3/uL (ref 0.00–0.07)
Basophils Absolute: 0 10*3/uL (ref 0.0–0.1)
Basophils Relative: 0 %
Eosinophils Absolute: 0.1 10*3/uL (ref 0.0–0.5)
Eosinophils Relative: 1 %
HCT: 39 % (ref 39.0–52.0)
Hemoglobin: 12.8 g/dL — ABNORMAL LOW (ref 13.0–17.0)
Immature Granulocytes: 1 %
Lymphocytes Relative: 25 %
Lymphs Abs: 2.4 10*3/uL (ref 0.7–4.0)
MCH: 31.7 pg (ref 26.0–34.0)
MCHC: 32.8 g/dL (ref 30.0–36.0)
MCV: 96.5 fL (ref 80.0–100.0)
Monocytes Absolute: 1.3 10*3/uL — ABNORMAL HIGH (ref 0.1–1.0)
Monocytes Relative: 13 %
Neutro Abs: 6 10*3/uL (ref 1.7–7.7)
Neutrophils Relative %: 60 %
Platelets: 269 10*3/uL (ref 150–400)
RBC: 4.04 MIL/uL — ABNORMAL LOW (ref 4.22–5.81)
RDW: 13.5 % (ref 11.5–15.5)
WBC: 10 10*3/uL (ref 4.0–10.5)
nRBC: 0 % (ref 0.0–0.2)

## 2021-02-04 MED ORDER — AMOXICILLIN 500 MG PO CAPS: 500.0000 mg | ORAL_CAPSULE | Freq: Two times a day (BID) | ORAL | 0 refills | Status: AC

## 2021-02-04 MED ORDER — OXYCODONE HCL 5 MG PO TABS: 5.0000 mg | ORAL_TABLET | Freq: Four times a day (QID) | ORAL | 0 refills | Status: AC | PRN

## 2021-02-04 NOTE — Discharge Instructions (Addendum)
Take the medications as prescribed  Return for new or worsening symptoms 

## 2021-02-04 NOTE — ED Provider Notes (Signed)
[facility]-EMERGENCY DEPT Provider Note   CSN: [id number] Arrival date & time: 02/04/21  1344     History Chief Complaint  Patient presents with   Fatigue    Caleb Torres is a 45 y.o. male with past medical history significant for COVID diagnosis 11 days ago who presents for evaluation of multiple complaints.  States he has still been having some chills cough, headaches.  Noted 2 days ago he had some right upper dental pain and some mild facial swelling.  No overlying redness or warmth.  States he has continued to have decreased appetite and fatigue since being diagnosed with COVID.  No unintentional weight loss.  Has not had to take anything for fever however due to the chills he feels like he has had a persistent fever.  Feels overall drained.  No sudden onset thunderclap headache, paresthesias, weakness, neck pain, chest pain, shortness of breath, abdominal pain, diarrhea, dysuria.  Denies additional aggravating or alleviating factors.  Patient obtained from patient and past medical records.  No interpreter used.  HPI     History reviewed. No pertinent past medical history.  There are no problems to display for this patient.   History reviewed. No pertinent surgical history.     Family History  Family history unknown: Yes    Social History   Tobacco Use   Smoking status: Every Day    Packs/day: 0.15    Types: Cigarettes   Smokeless tobacco: Never  Vaping Use   Vaping Use: Never used  Substance Use Topics   Alcohol use: No   Drug use: No    Home Medications Prior to Admission medications   Medication Sig Start Date End Date Taking? Authorizing Provider  amoxicillin (AMOXIL) 500 MG capsule Take 1 capsule (500 mg total) by mouth 2 (two) times daily for 7 days. 02/04/21 02/11/21 Yes Omri Bertran A, PA-C  oxyCODONE (ROXICODONE) 5 MG immediate release tablet Take 1 tablet (5 mg total) by mouth every 6 (six) hours as needed for up  to 3 days for severe pain. 02/04/21 02/07/21 Yes Illias Pantano A, PA-C  fluticasone (FLONASE) 50 MCG/ACT nasal spray Place 2 sprays into both nostrils daily. 01/08/21   Placido Sou, PA-C  naproxen (NAPROSYN) 500 MG tablet Take 1 tablet (500 mg total) by mouth 2 (two) times daily. Patient not taking: Reported on 02/24/2020 07/02/19   Gwyneth Sprout, MD  sulfamethoxazole-trimethoprim (BACTRIM DS) 800-160 MG tablet Take 1 tablet by mouth 2 (two) times daily. Patient not taking: Reported on 02/24/2020 01/19/19   Verdene Lennert, MD    Allergies    Aspirin  Review of Systems   Review of Systems  Constitutional:  Positive for activity change, appetite change, chills and fatigue.  HENT:  Positive for dental problem and facial swelling. Negative for congestion, nosebleeds, postnasal drip, rhinorrhea, sinus pressure, sinus pain, sneezing, sore throat, trouble swallowing and voice change.   Respiratory:  Positive for cough.   Cardiovascular: Negative.   Gastrointestinal: Negative.   Genitourinary: Negative.   Musculoskeletal: Negative.   Skin: Negative.   All other systems reviewed and are negative.  Physical Exam Updated Vital Signs BP 120/67   Pulse 80   Temp 98.9 F (37.2 C) (Oral)   Resp 18   SpO2 99%   Physical Exam Vitals and nursing note reviewed.  Constitutional:      General: He is not in acute distress.    Appearance: He is well-developed. He is not ill-appearing, toxic-appearing or diaphoretic.  HENT:     Head: Normocephalic and atraumatic.     Jaw: There is normal jaw occlusion.     Comments: Very minimal facial swelling over right lower zygomatic over dental tenderness, does not extend into the orbital area.  No fluctuance or induration.  No submandibular induration or warmth.    Right Ear: Tympanic membrane, ear canal and external ear normal.     Left Ear: Tympanic membrane, ear canal and external ear normal.     Nose: Nose normal. No congestion or rhinorrhea.      Mouth/Throat:     Comments: Poor dentition.  Missing teeth.  Gingival erythema surrounding some missing teeth to right upper extremity.  No obvious periapical abscess.  Sublingual area soft.  No drooling, dysphagia or trismus Eyes:     Pupils: Pupils are equal, round, and reactive to light.  Cardiovascular:     Rate and Rhythm: Normal rate and regular rhythm.     Pulses: Normal pulses.     Heart sounds: Normal heart sounds.  Pulmonary:     Effort: Pulmonary effort is normal. No respiratory distress.     Breath sounds: Normal breath sounds.  Abdominal:     General: Bowel sounds are normal. There is no distension.     Palpations: Abdomen is soft.     Tenderness: There is no abdominal tenderness. There is no guarding or rebound.  Musculoskeletal:        General: No swelling or tenderness. Normal range of motion.     Cervical back: Normal range of motion and neck supple.     Right lower leg: No edema.     Left lower leg: No edema.  Skin:    General: Skin is warm and dry.     Capillary Refill: Capillary refill takes less than 2 seconds.  Neurological:     General: No focal deficit present.     Mental Status: He is alert and oriented to person, place, and time.    ED Results / Procedures / Treatments   Labs (all labs ordered are listed, but only abnormal results are displayed) Labs Reviewed  CBC WITH DIFFERENTIAL/PLATELET - Abnormal; Notable for the following components:      Result Value   RBC 4.04 (*)    Hemoglobin 12.8 (*)    Monocytes Absolute 1.3 (*)    All other components within normal limits  BASIC METABOLIC PANEL    EKG None  Radiology DG Chest 2 View  Result Date: 02/04/2021 CLINICAL DATA:  Recent coronavirus infection. Worsening cough. Persistent fever. EXAM: CHEST - 2 VIEW COMPARISON:  None. FINDINGS: Heart size is normal. Mediastinal shadows are normal. The lungs are clear. No bronchial thickening. No infiltrate, mass, effusion or collapse. Pulmonary vascularity  is normal. No bony abnormality. IMPRESSION: Normal chest Electronically Signed   By: Paulina Fusi M.D.   On: 02/04/2021 15:57    Procedures Procedures   Medications Ordered in ED Medications - No data to display  ED Course  I have reviewed the triage vital signs and the nursing notes.  Pertinent labs & imaging results that were available during my care of the patient were reviewed by me and considered in my medical decision making (see chart for details).  Here for evaluation of feeling unwell.  He is afebrile, nonseptic, not ill-appearing.  Diagnosed with COVID just under 2 weeks ago.  Continues to have some fatigue, cough, overall decreased appetite.  Did note 2 days ago he had some right upper dental  pain with some mild overlying facial swelling.  He has no evidence of fluctuance induration to suggest abscess.  Facial swelling does not extend into orbital area.  He does have overall poor dentition with some missing teeth.  There is some gingival erythema to right upper dentition where patient's pain is located.  No obvious abscess to drain.  His sublingual area is soft.  He has no evidence of PTA, RPA, low suspicion for Ludwig's or deep space infection.  Patient's pain likely related to dental infection.  With regards to his cough, fatigue, chills.  This is likely some lingering post-COVID symptoms.  He does not appear septic.  Labs and imaging personally reviewed and interpreted:  CBC without leukocytosis, hemoglobin stable from prior Metabolic panel without electrolyte, renal abnormality Chest x-ray without infiltrate  Patient reassessed.  Discussed his labs and imaging.  DC home symptomatic management, close follow-up with PCP  The patient has been appropriately medically screened and/or stabilized in the ED. I have low suspicion for any other emergent medical condition which would require further screening, evaluation or treatment in the ED or require inpatient management.  Patient is  hemodynamically stable and in no acute distress.  Patient able to ambulate in department prior to ED.  Evaluation does not show acute pathology that would require ongoing or additional emergent interventions while in the emergency department or further inpatient treatment.  I have discussed the diagnosis with the patient and answered all questions.  Pain is been managed while in the emergency department and patient has no further complaints prior to discharge.  Patient is comfortable with plan discussed in room and is stable for discharge at this time.  I have discussed strict return precautions for returning to the emergency department.  Patient was encouraged to follow-up with PCP/specialist refer to at discharge.     MDM Rules/Calculators/A&P                           Caleb Torres was evaluated in Emergency Department on 02/04/2021 for the symptoms described in the history of present illness. He was evaluated in the context of the global COVID-19 pandemic, which necessitated consideration that the patient might be at risk for infection with the SARS-CoV-2 virus that causes COVID-19. Institutional protocols and algorithms that pertain to the evaluation of patients at risk for COVID-19 are in a state of rapid change based on information released by regulatory bodies including the CDC and federal and state organizations. These policies and algorithms were followed during the patient's care in the ED.  Final Clinical Impression(s) / ED Diagnoses Final diagnoses:  Fatigue, unspecified type  COVID  Pain, dental    Rx / DC Orders ED Discharge Orders          Ordered    amoxicillin (AMOXIL) 500 MG capsule  2 times daily        02/04/21 1614    oxyCODONE (ROXICODONE) 5 MG immediate release tablet  Every 6 hours PRN        02/04/21 1614             Ravis Herne A, PA-C 02/04/21 1614    Wynetta Fines, MD 02/05/21 [phone]

## 2021-02-04 NOTE — ED Provider Notes (Signed)
Emergency Medicine Provider Triage Evaluation Note  Caleb Torres , a 45 y.o. male  was evaluated in triage.  Pt complains of fever, chills, persistent cough, headaches.  Diagnosed with COVID 2 weeks ago.  Has very little energy, decreased appetite.  Having night sweats at home.  Review of Systems  Positive: Cough, fatigue, fever, headache Negative: Nausea, vomiting, hemoptysis, abdominal pain diarrhea  Physical Exam  BP 123/86 (BP Location: Right Arm)   Pulse 84   Temp 98.9 F (37.2 C) (Oral)   Resp 17   SpO2 98%  Gen:   Awake, no distress   Resp:  Normal effort  MSK:   Moves extremities without difficulty  Other:    Medical Decision Making  Medically screening exam initiated at 2:06 PM.  Appropriate orders placed.  Caleb Torres was informed that the remainder of the evaluation will be completed by another provider, this initial triage assessment does not replace that evaluation, and the importance of remaining in the ED until their evaluation is complete.  Cough, fever, COVID-positive 2 weeks ago  Vital signs stable, labs and imaging ordered   Ralph Leyden A, PA-C 02/04/21 1407    Wynetta Fines, MD 02/05/21 [phone]

## 2021-02-04 NOTE — ED Triage Notes (Signed)
Pt tested positive for COVID on 7/10. Pt reports consistent fatigue, loss of appetite, headache, and chills.

## 2021-02-04 NOTE — ED Notes (Signed)
An After Visit Summary was printed and given to the patient. Discharge instructions given and no further questions at this time.  

## 2021-02-12 ENCOUNTER — Encounter (HOSPITAL_COMMUNITY): Payer: Self-pay

## 2021-02-12 ENCOUNTER — Emergency Department (HOSPITAL_COMMUNITY)
Admission: EM | Admit: 2021-02-12 | Discharge: 2021-02-12 | Disposition: A | Discharge status: Home / Self Care | Payer: BC Managed Care – PPO | Attending: Emergency Medicine | Admitting: Emergency Medicine

## 2021-02-12 ENCOUNTER — Other Ambulatory Visit: Payer: Self-pay

## 2021-02-12 DIAGNOSIS — G5601 Carpal tunnel syndrome, right upper limb: Secondary | ICD-10-CM

## 2021-02-12 DIAGNOSIS — F17210 Nicotine dependence, cigarettes, uncomplicated: Secondary | ICD-10-CM | POA: Diagnosis not present

## 2021-02-12 DIAGNOSIS — M25531 Pain in right wrist: Secondary | ICD-10-CM | POA: Diagnosis present

## 2021-02-12 MED ORDER — CELECOXIB 200 MG PO CAPS: 200.0000 mg | ORAL_CAPSULE | Freq: Two times a day (BID) | ORAL | 0 refills | Status: AC

## 2021-02-12 NOTE — Discharge Instructions (Addendum)
Wear your brace at all times except when bathing, even at night. Ice 5 times a day daily. Take your antiinflammatory medication at least once daily, you may also use tylenol for pain. Follow up with the specialist. Contact a health care provider if: You have new symptoms. Your pain is not controlled with medicines. Your symptoms get worse. Get help right away if: You have severe numbness or tingling in your wrist or hand.

## 2021-02-12 NOTE — ED Provider Notes (Signed)
Caleb Torres   CSN: [id number] Arrival date & time: 02/12/21  1120     History Chief Complaint  Patient presents with   Hand Pain    Caleb Torres is a 45 y.o. male.  Who presents emergency department chief complaint of right wrist and hand pain.  Pain is predominantly in the middle 3 fingers.  Worse at night, wakes him from sleep, associated numbness and tingling in those feet fingers.  He works with his hands daily.  He has noticed swelling in his wrist.  He has had no injuries to the hand.  At times the pain aches all the way up his arms.  He rates his pain as severe.  He has had some decreased grip strength.  He denies severe neck pain.   Hand Pain      No past medical history on file.  There are no problems to display for this patient.   No past surgical history on file.     Family History  Family history unknown: Yes    Social History   Tobacco Use   Smoking status: Every Day    Packs/day: 0.15    Types: Cigarettes   Smokeless tobacco: Never  Vaping Use   Vaping Use: Never used  Substance Use Topics   Alcohol use: No   Drug use: No    Home Medications Prior to Admission medications   Medication Sig Start Date End Date Taking? Authorizing Provider  fluticasone (FLONASE) 50 MCG/ACT nasal spray Place 2 sprays into both nostrils daily. 01/08/21   Placido Sou, PA-C  naproxen (NAPROSYN) 500 MG tablet Take 1 tablet (500 mg total) by mouth 2 (two) times daily. Patient not taking: Reported on 02/24/2020 07/02/19   Gwyneth Sprout, MD  sulfamethoxazole-trimethoprim (BACTRIM DS) 800-160 MG tablet Take 1 tablet by mouth 2 (two) times daily. Patient not taking: Reported on 02/24/2020 01/19/19   Verdene Lennert, MD    Allergies    Aspirin  Review of Systems   Review of Systems Positive for right wrist and hand pain, positive for numbness and tingling, negative for neck pain, injury, wound, fevers, chills, changes  in skin color. Physical Exam Updated Vital Signs BP 127/80 (BP Location: Right Arm)   Pulse 100   Temp 98 F (36.7 C) (Oral)   Resp 18   SpO2 100%   Physical Exam Physical Exam  Nursing Torres and vitals reviewed. Constitutional: He appears well-developed and well-nourished. No distress.  HENT:  Head: Normocephalic and atraumatic.  Eyes: Conjunctivae normal are normal. No scleral icterus.  Neck: Normal range of motion. Neck supple.  Cardiovascular: Normal rate, regular rhythm and normal heart sounds.   Pulmonary/Chest: Effort normal and breath sounds normal. No respiratory distress.  Abdominal: Soft. There is no tenderness.  Musculoskeletal: No obvious edema.  Positive Tinel and Phalen sign on the right wrist.  Equal grip strength bilaterally. Neurological: He is alert.  Skin: Skin is warm and dry. He is not diaphoretic.  Psychiatric: His behavior is normal.   ED Results / Procedures / Treatments   Labs (all labs ordered are listed, but only abnormal results are displayed) Labs Reviewed - No data to display  EKG None  Radiology No results found.  Procedures Procedures   Medications Ordered in ED Medications - No data to display  ED Course  I have reviewed the triage vital signs and the nursing notes.  Pertinent labs & imaging results that were available during my care of  the patient were reviewed by me and considered in my medical decision making (see chart for details).    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  Placed in wrist splint, RICE and conservative therapy along with anti-inflammatory.  Patient given outpatient follow-up with hand specialist.  Rx / DC Orders ED Discharge Orders     None        Arthor Captain, PA-C 02/12/21 1141    Terald Sleeper, MD 02/12/21 1742

## 2021-02-12 NOTE — ED Triage Notes (Signed)
Patient reports that he has right sided hand pain and swelling x 1 year and states he came today because he has been dropping things more frequently when holding objects. 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

## 2021-03-19 ENCOUNTER — Emergency Department (HOSPITAL_COMMUNITY): Payer: BC Managed Care – PPO

## 2021-03-19 ENCOUNTER — Encounter (HOSPITAL_COMMUNITY): Payer: Self-pay

## 2021-03-19 ENCOUNTER — Emergency Department (HOSPITAL_COMMUNITY)
Admission: EM | Admit: 2021-03-19 | Discharge: 2021-03-19 | Disposition: A | Discharge status: Home / Self Care | Payer: BC Managed Care – PPO | Attending: Emergency Medicine | Admitting: Emergency Medicine

## 2021-03-19 DIAGNOSIS — S39012A Strain of muscle, fascia and tendon of lower back, initial encounter: Secondary | ICD-10-CM | POA: Diagnosis not present

## 2021-03-19 DIAGNOSIS — Y92410 Unspecified street and highway as the place of occurrence of the external cause: Secondary | ICD-10-CM | POA: Insufficient documentation

## 2021-03-19 DIAGNOSIS — F17210 Nicotine dependence, cigarettes, uncomplicated: Secondary | ICD-10-CM | POA: Insufficient documentation

## 2021-03-19 DIAGNOSIS — S3992XA Unspecified injury of lower back, initial encounter: Secondary | ICD-10-CM | POA: Diagnosis present

## 2021-03-19 DIAGNOSIS — M5126 Other intervertebral disc displacement, lumbar region: Secondary | ICD-10-CM | POA: Diagnosis not present

## 2021-03-19 MED ORDER — IBUPROFEN 600 MG PO TABS: 600.0000 mg | ORAL_TABLET | Freq: Four times a day (QID) | ORAL | 0 refills | Status: AC | PRN

## 2021-03-19 MED ORDER — ACETAMINOPHEN 325 MG PO TABS: 650.0000 mg | ORAL_TABLET | Freq: Four times a day (QID) | ORAL | 0 refills | Status: AC | PRN

## 2021-03-19 MED ORDER — LIDOCAINE 5 % EX PTCH
1.0000 | MEDICATED_PATCH | Freq: Once | CUTANEOUS | Status: DC
  Administered 2021-03-19: 1 via TRANSDERMAL
  Filled 2021-03-19: qty 1

## 2021-03-19 MED ORDER — METHOCARBAMOL 500 MG PO TABS
1000.0000 mg | ORAL_TABLET | Freq: Once | ORAL | Status: AC
  Administered 2021-03-19: 1000 mg via ORAL
  Filled 2021-03-19: qty 2

## 2021-03-19 MED ORDER — LIDOCAINE 5 % EX PTCH: 1.0000 | MEDICATED_PATCH | Freq: Every day | CUTANEOUS | 0 refills | Status: AC | PRN

## 2021-03-19 MED ORDER — ACETAMINOPHEN 325 MG PO TABS
650.0000 mg | ORAL_TABLET | Freq: Once | ORAL | Status: AC
  Administered 2021-03-19: 650 mg via ORAL
  Filled 2021-03-19: qty 2

## 2021-03-19 MED ORDER — METHOCARBAMOL 500 MG PO TABS: 500.0000 mg | ORAL_TABLET | Freq: Two times a day (BID) | ORAL | 0 refills | Status: AC | PRN
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
## 2021-06-24 ENCOUNTER — Encounter (HOSPITAL_COMMUNITY): Payer: Self-pay

## 2021-06-24 ENCOUNTER — Emergency Department (HOSPITAL_COMMUNITY)
Admission: EM | Admit: 2021-06-24 | Discharge: 2021-06-24 | Disposition: A | Discharge status: Home / Self Care | Payer: BC Managed Care – PPO | Attending: Emergency Medicine | Admitting: Emergency Medicine

## 2021-06-24 ENCOUNTER — Emergency Department (HOSPITAL_COMMUNITY): Payer: BC Managed Care – PPO

## 2021-06-24 DIAGNOSIS — F17210 Nicotine dependence, cigarettes, uncomplicated: Secondary | ICD-10-CM | POA: Insufficient documentation

## 2021-06-24 DIAGNOSIS — M545 Low back pain, unspecified: Secondary | ICD-10-CM | POA: Insufficient documentation

## 2021-06-24 DIAGNOSIS — Y92410 Unspecified street and highway as the place of occurrence of the external cause: Secondary | ICD-10-CM | POA: Insufficient documentation

## 2021-06-24 MED ORDER — METHOCARBAMOL 500 MG PO TABS: 500.0000 mg | ORAL_TABLET | Freq: Two times a day (BID) | ORAL | 0 refills | Status: AC
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
## 2021-06-24 NOTE — Discharge Instructions (Signed)
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
## 2021-09-01 ENCOUNTER — Encounter (HOSPITAL_COMMUNITY): Payer: Self-pay

## 2021-09-01 ENCOUNTER — Other Ambulatory Visit: Payer: Self-pay

## 2021-09-01 ENCOUNTER — Emergency Department (HOSPITAL_COMMUNITY)
Admission: EM | Admit: 2021-09-01 | Discharge: 2021-09-01 | Disposition: A | Discharge status: Home / Self Care | Payer: BC Managed Care – PPO | Attending: Emergency Medicine | Admitting: Emergency Medicine

## 2021-09-01 DIAGNOSIS — R197 Diarrhea, unspecified: Secondary | ICD-10-CM | POA: Diagnosis not present

## 2021-09-01 DIAGNOSIS — Z5321 Procedure and treatment not carried out due to patient leaving prior to being seen by health care provider: Secondary | ICD-10-CM | POA: Insufficient documentation

## 2021-09-01 DIAGNOSIS — Z20822 Contact with and (suspected) exposure to covid-19: Secondary | ICD-10-CM | POA: Diagnosis not present

## 2021-09-01 DIAGNOSIS — R112 Nausea with vomiting, unspecified: Secondary | ICD-10-CM | POA: Insufficient documentation

## 2021-09-01 DIAGNOSIS — R109 Unspecified abdominal pain: Secondary | ICD-10-CM | POA: Insufficient documentation

## 2021-09-01 LAB — RESP PANEL BY RT-PCR (FLU A&B, COVID) ARPGX2
Influenza A by PCR: NEGATIVE
Influenza B by PCR: NEGATIVE
SARS Coronavirus 2 by RT PCR: NEGATIVE

## 2021-09-01 MED ORDER — ACETAMINOPHEN 325 MG PO TABS
650.0000 mg | ORAL_TABLET | Freq: Once | ORAL | Status: AC
  Administered 2021-09-01: 650 mg via ORAL
  Filled 2021-09-01: qty 2

## 2021-09-01 NOTE — ED Triage Notes (Signed)
Pt BIB EMS. Pt reports with diarrhea, nausea, vomiting, and abdominal pain x 3 days. Pt states that he has been around some friends that are sick.

## 2021-09-01 NOTE — ED Notes (Signed)
Pt left stated he couldn't wait
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
## 2024-01-03 ENCOUNTER — Emergency Department (HOSPITAL_COMMUNITY)

## 2024-01-03 ENCOUNTER — Emergency Department (HOSPITAL_COMMUNITY)
Admission: EM | Admit: 2024-01-03 | Discharge: 2024-01-03 | Disposition: A | Discharge status: Home / Self Care | Attending: Emergency Medicine | Admitting: Emergency Medicine

## 2024-01-03 ENCOUNTER — Other Ambulatory Visit: Payer: Self-pay

## 2024-01-03 ENCOUNTER — Encounter (HOSPITAL_COMMUNITY): Payer: Self-pay | Admitting: Emergency Medicine

## 2024-01-03 DIAGNOSIS — R051 Acute cough: Secondary | ICD-10-CM | POA: Insufficient documentation

## 2024-01-03 DIAGNOSIS — D72829 Elevated white blood cell count, unspecified: Secondary | ICD-10-CM | POA: Diagnosis not present

## 2024-01-03 DIAGNOSIS — R509 Fever, unspecified: Secondary | ICD-10-CM | POA: Diagnosis not present

## 2024-01-03 DIAGNOSIS — R519 Headache, unspecified: Secondary | ICD-10-CM | POA: Insufficient documentation

## 2024-01-03 DIAGNOSIS — Z720 Tobacco use: Secondary | ICD-10-CM | POA: Insufficient documentation

## 2024-01-03 DIAGNOSIS — R0602 Shortness of breath: Secondary | ICD-10-CM | POA: Insufficient documentation

## 2024-01-03 DIAGNOSIS — R059 Cough, unspecified: Secondary | ICD-10-CM | POA: Diagnosis present

## 2024-01-03 LAB — BASIC METABOLIC PANEL WITH GFR
Anion gap: 12 (ref 5–15)
BUN: 12 mg/dL (ref 6–20)
CO2: 21 mmol/L — ABNORMAL LOW (ref 22–32)
Calcium: 8.9 mg/dL (ref 8.9–10.3)
Chloride: 103 mmol/L (ref 98–111)
Creatinine, Ser: 1.15 mg/dL (ref 0.61–1.24)
GFR, Estimated: >60 mL/min (ref >60)
Glucose, Bld: 92 mg/dL (ref 70–99)
Potassium: 3.8 mmol/L (ref 3.5–5.1)
Sodium: 136 mmol/L (ref 135–145)

## 2024-01-03 LAB — CBC
HCT: 40.9 % (ref 39.0–52.0)
Hemoglobin: 13.6 g/dL (ref 13.0–17.0)
MCH: 31.4 pg (ref 26.0–34.0)
MCHC: 33.3 g/dL (ref 30.0–36.0)
MCV: 94.5 fL (ref 80.0–100.0)
Platelets: 292 10*3/uL (ref 150–400)
RBC: 4.33 MIL/uL (ref 4.22–5.81)
RDW: 14 % (ref 11.5–15.5)
WBC: 11.5 10*3/uL — ABNORMAL HIGH (ref 4.0–10.5)
nRBC: 0 % (ref 0.0–0.2)

## 2024-01-03 LAB — RESP PANEL BY RT-PCR (RSV, FLU A&B, COVID)  RVPGX2
Influenza A by PCR: NEGATIVE
Influenza B by PCR: NEGATIVE
Resp Syncytial Virus by PCR: NEGATIVE
SARS Coronavirus 2 by RT PCR: NEGATIVE

## 2024-01-03 MED ORDER — ALBUTEROL SULFATE HFA 108 (90 BASE) MCG/ACT IN AERS
2.0000 | INHALATION_SPRAY | Freq: Once | RESPIRATORY_TRACT | Status: AC
  Administered 2024-01-03: 2 via RESPIRATORY_TRACT
  Filled 2024-01-03: qty 6.7

## 2024-01-03 MED ORDER — BENZONATATE 100 MG PO CAPS: 100.0000 mg | ORAL_CAPSULE | Freq: Three times a day (TID) | ORAL | 0 refills | Status: AC

## 2024-01-03 NOTE — ED Provider Notes (Signed)
 [place] EMERGENCY DEPARTMENT AT [facility] Provider Note   CSN: [id number] Arrival date & time: 01/03/24  1745     Patient presents with: Cough and Night Sweats   Caleb Torres is a 48 y.o. male.  Patient with no relevant past medical history presents the emergency room complaining of cough subjective fever and chills for the past week.  He also endorses a subjective feeling of shortness of breath.  He states he feels like he is coughing and has a hard time catching his breath during the cough.  He does state that his brother had a recent cough.  Patient also endorses a mild headache.  He denies nausea, vomiting, diarrhea, chest pain, abdominal pain.    Cough      Prior to Admission medications   Medication Sig Start Date End Date Taking? Authorizing Provider  benzonatate (TESSALON) 100 MG capsule Take 1 capsule (100 mg total) by mouth every 8 (eight) hours. 01/03/24  Yes Elisa Guest, PA-C  acetaminophen  (TYLENOL ) 325 MG tablet Take 2 tablets (650 mg total) by mouth every 6 (six) hours as needed. 03/19/21   Teddi Favors, DO  celecoxib  (CELEBREX ) 200 MG capsule Take 1 capsule (200 mg total) by mouth 2 (two) times daily. 02/12/21   Harris, Abigail, PA-C  fluticasone  (FLONASE ) 50 MCG/ACT nasal spray Place 2 sprays into both nostrils daily. 01/08/21   Joldersma, Logan, PA-C  ibuprofen  (ADVIL ) 600 MG tablet Take 1 tablet (600 mg total) by mouth every 6 (six) hours as needed. 03/19/21   Teddi Favors, DO  methocarbamol  (ROBAXIN ) 500 MG tablet Take 1 tablet (500 mg total) by mouth 2 (two) times daily. 06/24/21   Couture, Cortni S, PA-C  sulfamethoxazole -trimethoprim  (BACTRIM  DS) 800-160 MG tablet Take 1 tablet by mouth 2 (two) times daily. Patient not taking: Reported on 02/24/2020 01/19/19   Avi Body, MD    Allergies: Aspirin    Review of Systems  Respiratory:  Positive for cough.     Updated Vital Signs BP 127/77 (BP Location: Right Arm)   Pulse 79   Temp 98.5  F (36.9 C) (Oral)   Resp 16   SpO2 98%   Physical Exam Vitals and nursing note reviewed.  Constitutional:      General: He is not in acute distress.    Appearance: He is well-developed.  HENT:     Head: Normocephalic and atraumatic.   Eyes:     Conjunctiva/sclera: Conjunctivae normal.    Cardiovascular:     Rate and Rhythm: Normal rate and regular rhythm.     Heart sounds: No murmur heard. Pulmonary:     Effort: Pulmonary effort is normal. No respiratory distress.     Breath sounds: Wheezing present.     Comments: Mild left-sided expiratory wheezes, improved after albuterol Abdominal:     Palpations: Abdomen is soft.     Tenderness: There is no abdominal tenderness.   Musculoskeletal:        General: No swelling.     Cervical back: Neck supple.   Skin:    General: Skin is warm and dry.     Capillary Refill: Capillary refill takes less than 2 seconds.   Neurological:     Mental Status: He is alert.   Psychiatric:        Mood and Affect: Mood normal.     (all labs ordered are listed, but only abnormal results are displayed) Labs Reviewed  BASIC METABOLIC PANEL WITH GFR - Abnormal; Notable  for the following components:      Result Value   CO2 21 (*)    All other components within normal limits  CBC - Abnormal; Notable for the following components:   WBC 11.5 (*)    All other components within normal limits  RESP PANEL BY RT-PCR (RSV, FLU A&B, COVID)  [id number]    EKG: None  Radiology: DG Chest 2 View Result Date: 01/03/2024 CLINICAL DATA:  Shortness of breath. Night sweats. Chills and cough EXAM: CHEST - 2 VIEW COMPARISON:  Chest x-ray 03/19/2021. FINDINGS: No consolidation, pneumothorax or effusion. No edema. Normal cardiopericardial silhouette. Overlapping cardiac leads. IMPRESSION: No acute cardiopulmonary disease. Electronically Signed   By: Adrianna Horde M.D.   On: 01/03/2024 18:39     Procedures   Medications Ordered in the ED  albuterol (VENTOLIN  HFA) 108 (90 Base) MCG/ACT inhaler 2 puff (2 puffs Inhalation Given 01/03/24 2224)                                    Medical Decision Making Risk Prescription drug management.   This patient presents to the ED for concern of cough, this involves an extensive number of treatment options, and is a complaint that carries with it a high risk of complications and morbidity.  The differential diagnosis includes COVID, influenza, RSV, other viral infection, pneumonia, asthma, tuberculosis, others   Co morbidities / Chronic conditions that complicate the patient evaluation  None   Additional history obtained:  Additional history obtained from EMR   Lab Tests:  I Ordered, and personally interpreted labs.  The pertinent results include: Mild leukocytosis with a white count of 11,500, negative respiratory panel   Imaging Studies ordered:  I ordered imaging studies including chest x-ray I independently visualized and interpreted imaging which showed no acute findings I agree with the radiologist interpretation   Cardiac Monitoring: / EKG:  The patient was maintained on a cardiac monitor.  I personally viewed and interpreted the cardiac monitored which showed an underlying rhythm of: Sinus rhythm   Problem List / ED Course / Critical interventions / Medication management   I ordered medication including albuterol Reevaluation of the patient after these medicines showed that the patient improved I have reviewed the patients home medicines and have made adjustments as needed   Social Determinants of Health:  Patient is a daily smoker   Test / Admission - Considered:  Chest x-ray showing no pneumonia, no findings suggestive tuberculosis or malignancy.  Viral panel negative.  Patient's presentation is consistent with an upper respiratory infection.  He did have mild left-sided wheezing which improved with albuterol.  Plan to discharge home with prescription for Tessalon and  instructions on albuterol usage.  Patient to follow-up with primary care provider.  Return precautions provided.      Final diagnoses:  Acute cough    ED Discharge Orders          Ordered    benzonatate (TESSALON) 100 MG capsule  Every 8 hours        01/03/24 2220               Delories Fetter 01/03/24 2258    Lind Repine, MD 01/04/24 1611

## 2024-01-03 NOTE — Discharge Instructions (Addendum)
 Your presentation this evening is consistent with an upper respiratory infection.  You may use the albuterol, up to 1 puff every 4-6 hours for wheezing/shortness of breath.  I also prescribed a medication called Tessalon which can help with the cough.  Follow-up as needed with your primary care provider.  If you develop any life-threatening symptoms return to the emergency department.

## 2024-01-03 NOTE — ED Triage Notes (Addendum)
 Patient present due to coughing at night for 7 days, night sweats last 3-4 nights, shortness of breath, decreased appetite and headache. He reports his brother had a cough as well. Patient denies nausea, vomiting and diarrhea.
